# Patient Record
Sex: Male | Born: 1980 | Hispanic: No | Marital: Married | Smoking: Never smoker
Health system: Southern US, Community
[De-identification: ages and names within clinical notes are randomized; demographics above are authoritative.]

## PROBLEM LIST (undated history)

## (undated) DIAGNOSIS — E785 Hyperlipidemia, unspecified: Secondary | ICD-10-CM

## (undated) DIAGNOSIS — E781 Pure hyperglyceridemia: Secondary | ICD-10-CM

## (undated) HISTORY — DX: Hyperlipidemia, unspecified: E78.5

## (undated) HISTORY — DX: Pure hyperglyceridemia: E78.1

---

## 2009-09-14 HISTORY — PX: CARDIOVASCULAR STRESS TEST: SHX262

## 2010-07-24 ENCOUNTER — Ambulatory Visit: Payer: Self-pay | Admitting: Internal Medicine

## 2010-07-24 DIAGNOSIS — M79609 Pain in unspecified limb: Secondary | ICD-10-CM

## 2010-07-24 DIAGNOSIS — E785 Hyperlipidemia, unspecified: Secondary | ICD-10-CM | POA: Insufficient documentation

## 2010-07-24 HISTORY — DX: Hyperlipidemia, unspecified: E78.5

## 2010-07-24 LAB — CONVERTED CEMR LAB
Alkaline Phosphatase: 55 units/L (ref 39–117)
Basophils Absolute: 0 10*3/uL (ref 0.0–0.1)
Bilirubin Urine: NEGATIVE
Bilirubin, Direct: 0.1 mg/dL (ref 0.0–0.3)
CO2: 31 meq/L (ref 19–32)
Calcium: 9.5 mg/dL (ref 8.4–10.5)
Creatinine, Ser: 0.9 mg/dL (ref 0.4–1.5)
Eosinophils Absolute: 0.1 10*3/uL (ref 0.0–0.7)
GFR calc non Af Amer: 113.13 mL/min (ref >60)
Glucose, Bld: 84 mg/dL (ref 70–99)
HDL: 27.1 mg/dL — ABNORMAL LOW (ref >39.00)
Hemoglobin, Urine: NEGATIVE
Leukocytes, UA: NEGATIVE
Lymphocytes Relative: 35.8 % (ref 12.0–46.0)
MCHC: 34.7 g/dL (ref 30.0–36.0)
Monocytes Relative: 10.9 % (ref 3.0–12.0)
Neutrophils Relative %: 51.3 % (ref 43.0–77.0)
Nitrite: NEGATIVE
RBC: 5.23 M/uL (ref 4.22–5.81)
RDW: 12.3 % (ref 11.5–14.6)
TSH: 1.02 microintl units/mL (ref 0.35–5.50)
Total CHOL/HDL Ratio: 6
Urobilinogen, UA: 0.2 (ref 0.0–1.0)
VLDL: 44 mg/dL — ABNORMAL HIGH (ref 0.0–40.0)
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
## 2011-04-30 ENCOUNTER — Emergency Department (HOSPITAL_COMMUNITY)
Admission: EM | Admit: 2011-04-30 | Discharge: 2011-05-01 | Disposition: A | Discharge status: Home / Self Care | Payer: BC Managed Care – PPO | Attending: Emergency Medicine | Admitting: Emergency Medicine

## 2011-04-30 ENCOUNTER — Emergency Department (HOSPITAL_COMMUNITY): Payer: BC Managed Care – PPO

## 2011-04-30 DIAGNOSIS — S62639A Displaced fracture of distal phalanx of unspecified finger, initial encounter for closed fracture: Secondary | ICD-10-CM | POA: Insufficient documentation

## 2011-04-30 DIAGNOSIS — Y9364 Activity, baseball: Secondary | ICD-10-CM | POA: Insufficient documentation

## 2011-04-30 DIAGNOSIS — Y9239 Other specified sports and athletic area as the place of occurrence of the external cause: Secondary | ICD-10-CM | POA: Insufficient documentation

## 2011-04-30 DIAGNOSIS — W219XXA Striking against or struck by unspecified sports equipment, initial encounter: Secondary | ICD-10-CM | POA: Insufficient documentation

## 2011-04-30 DIAGNOSIS — S61209A Unspecified open wound of unspecified finger without damage to nail, initial encounter: Secondary | ICD-10-CM | POA: Insufficient documentation

## 2011-11-01 ENCOUNTER — Encounter: Payer: Self-pay | Admitting: Internal Medicine

## 2011-11-01 DIAGNOSIS — E781 Pure hyperglyceridemia: Secondary | ICD-10-CM

## 2011-11-01 DIAGNOSIS — Z0000 Encounter for general adult medical examination without abnormal findings: Secondary | ICD-10-CM | POA: Insufficient documentation

## 2011-11-01 HISTORY — DX: Pure hyperglyceridemia: E78.1

## 2011-11-02 ENCOUNTER — Other Ambulatory Visit (INDEPENDENT_AMBULATORY_CARE_PROVIDER_SITE_OTHER): Payer: BC Managed Care – PPO

## 2011-11-02 ENCOUNTER — Encounter: Payer: Self-pay | Admitting: Internal Medicine

## 2011-11-02 ENCOUNTER — Ambulatory Visit (INDEPENDENT_AMBULATORY_CARE_PROVIDER_SITE_OTHER): Payer: BC Managed Care – PPO | Admitting: Internal Medicine

## 2011-11-02 VITALS — BP 108/80 | HR 79 | Temp 98.1°F | Ht 70.0 in | Wt 179.0 lb

## 2011-11-02 DIAGNOSIS — M779 Enthesopathy, unspecified: Secondary | ICD-10-CM | POA: Insufficient documentation

## 2011-11-02 DIAGNOSIS — Z0000 Encounter for general adult medical examination without abnormal findings: Secondary | ICD-10-CM

## 2011-11-02 LAB — CBC WITH DIFFERENTIAL/PLATELET
Eosinophils Relative: 3 % (ref 0.0–5.0)
HCT: 50.6 % (ref 39.0–52.0)
Hemoglobin: 17.5 g/dL — ABNORMAL HIGH (ref 13.0–17.0)
Lymphs Abs: 3.2 10*3/uL (ref 0.7–4.0)
Monocytes Relative: 10.4 % (ref 3.0–12.0)
Neutro Abs: 3.3 10*3/uL (ref 1.4–7.7)
RDW: 12.9 % (ref 11.5–14.6)
WBC: 7.6 10*3/uL (ref 4.5–10.5)

## 2011-11-02 LAB — LIPID PANEL
Cholesterol: 174 mg/dL (ref 0–200)
Total CHOL/HDL Ratio: 4
VLDL: 41.4 mg/dL — ABNORMAL HIGH (ref 0.0–40.0)

## 2011-11-02 LAB — URINALYSIS, ROUTINE W REFLEX MICROSCOPIC
Bilirubin Urine: NEGATIVE
Ketones, ur: NEGATIVE
Leukocytes, UA: NEGATIVE
Specific Gravity, Urine: >=1.03 (ref 1.000–1.030)
Urine Glucose: NEGATIVE
Urobilinogen, UA: 0.2 (ref 0.0–1.0)

## 2011-11-02 LAB — HEPATIC FUNCTION PANEL
Albumin: 4.5 g/dL (ref 3.5–5.2)
Alkaline Phosphatase: 57 U/L (ref 39–117)
Total Bilirubin: 1.2 mg/dL (ref 0.3–1.2)

## 2011-11-02 LAB — TSH: TSH: 2.26 u[IU]/mL (ref 0.35–5.50)

## 2011-11-02 LAB — BASIC METABOLIC PANEL
Calcium: 9.7 mg/dL (ref 8.4–10.5)
GFR: 107.75 mL/min (ref >60.00)
Glucose, Bld: 94 mg/dL (ref 70–99)
Sodium: 141 mEq/L (ref 135–145)

## 2011-11-02 NOTE — Assessment & Plan Note (Signed)
Mild medial right epicondylitis, for advil prn,  to f/u any worsening symptoms or concerns

## 2011-11-02 NOTE — Patient Instructions (Signed)
Continue all other medications as before such as OTC advil as needed Please go to LAB in the Basement for the blood and/or urine tests to be done today Please call the phone number [phone] (the PhoneTree System) for results of testing in 2-3 days;  When calling, simply dial the number, and when prompted enter the MRN number above (the Medical Record Number) and the # key, then the message should start. Please return in 1 year for your yearly visit, or sooner if needed, with Lab testing done 3-5 days before

## 2011-11-02 NOTE — Assessment & Plan Note (Signed)

## 2011-11-02 NOTE — Progress Notes (Signed)
Subjective:    Patient ID: Daniel Gallegos, male    DOB: 1981/06/30, 31 y.o.   MRN: [id number]  HPI  Here for wellness and f/u;  Overall doing ok;  Pt denies CP, worsening SOB, DOE, wheezing, orthopnea, PND, worsening LE edema, palpitations, dizziness or syncope.  Pt denies neurological change such as new Headache, facial or extremity weakness.  Pt denies polydipsia, polyuria, or low sugar symptoms. Pt states overall good compliance with treatment and medications, good tolerability, and trying to follow lower cholesterol diet.  Pt denies worsening depressive symptoms, suicidal ideation or panic. No fever, wt loss, night sweats, loss of appetite, or other constitutional symptoms.  Pt states good ability with ADL's, low fall risk, home safety reviewed and adequate, no significant changes in hearing or vision, and occasionally active with exercise.  Did have a pain to the left medial distal quad with walking around [place].  Has some pain to the medial epicondylar area. Past Medical History  Diagnosis Date  . Hypertriglyceridemia 11/01/2011  . HYPERLIPIDEMIA 07/24/2010   Past Surgical History  Procedure Date  . Cardiovascular stress test Jan 2011    s/p    reports that he has never smoked. He does not have any smokeless tobacco history on file. He reports that he drinks alcohol. He reports that he does not use illicit drugs. family history includes Alcohol abuse in his other and paternal uncle; Arthritis in his daughter; Cancer in his maternal uncle and other; Depression in his other; Heart attack in his father; and Heart disease in his other. No Known Allergies No current outpatient prescriptions on file prior to visit.   Review of Systems Review of Systems  Constitutional: Negative for diaphoresis, activity change, appetite change and unexpected weight change.  HENT: Negative for hearing loss, ear pain, facial swelling, mouth sores and neck stiffness.   Eyes: Negative for pain, redness and  visual disturbance.  Respiratory: Negative for shortness of breath and wheezing.   Cardiovascular: Negative for chest pain and palpitations.  Gastrointestinal: Negative for diarrhea, blood in stool, abdominal distention and rectal pain.  Genitourinary: Negative for hematuria, flank pain and decreased urine volume.  Musculoskeletal: Negative for myalgias and joint swelling.  Skin: Negative for color change and wound.  Neurological: Negative for syncope and numbness.  Hematological: Negative for adenopathy.  Psychiatric/Behavioral: Negative for hallucinations, self-injury, decreased concentration and agitation.      Objective:   Physical Exam BP 108/80  Pulse 79  Temp(Src) 98.1 F (36.7 C) (Oral)  Ht 5\' 10"  (1.778 m)  Wt 179 lb (81.194 kg)  BMI 25.68 kg/m2  SpO2 96% Physical Exam  VS noted Constitutional: Pt is oriented to person, place, and time. Appears well-developed and well-nourished.  HENT:  Head: Normocephalic and atraumatic.  Right Ear: External ear normal.  Left Ear: External ear normal.  Nose: Nose normal.  Mouth/Throat: Oropharynx is clear and moist.  Eyes: Conjunctivae and EOM are normal. Pupils are equal, round, and reactive to light.  Neck: Normal range of motion. Neck supple. No JVD present. No tracheal deviation present.  Cardiovascular: Normal rate, regular rhythm, normal heart sounds and intact distal pulses.   Pulmonary/Chest: Effort normal and breath sounds normal.  Abdominal: Soft. Bowel sounds are normal. There is no tenderness.  Musculoskeletal: Normal range of motion. Exhibits no edema.  mild tender right medial epicondylar area Lymphadenopathy:  Has no cervical adenopathy.  Neurological: Pt is alert and oriented to person, place, and time. Pt has normal reflexes. No cranial nerve  deficit.  Skin: Skin is warm and dry. No rash noted.  Psychiatric:  Has  normal mood and affect. Behavior is normal.     Assessment & Plan:

## 2012-12-29 ENCOUNTER — Telehealth: Payer: Self-pay

## 2012-12-29 ENCOUNTER — Ambulatory Visit (INDEPENDENT_AMBULATORY_CARE_PROVIDER_SITE_OTHER): Payer: BC Managed Care – PPO | Admitting: Emergency Medicine

## 2012-12-29 ENCOUNTER — Ambulatory Visit: Payer: BC Managed Care – PPO

## 2012-12-29 VITALS — BP 106/72 | HR 73 | Temp 98.2°F | Resp 16 | Ht 68.78 in | Wt 176.8 lb

## 2012-12-29 DIAGNOSIS — E785 Hyperlipidemia, unspecified: Secondary | ICD-10-CM

## 2012-12-29 DIAGNOSIS — R079 Chest pain, unspecified: Secondary | ICD-10-CM

## 2012-12-29 DIAGNOSIS — Z0000 Encounter for general adult medical examination without abnormal findings: Secondary | ICD-10-CM

## 2012-12-29 DIAGNOSIS — R0602 Shortness of breath: Secondary | ICD-10-CM

## 2012-12-29 LAB — COMPREHENSIVE METABOLIC PANEL
ALT: 21 U/L (ref 0–53)
AST: 21 U/L (ref 0–37)
Albumin: 4.5 g/dL (ref 3.5–5.2)
Alkaline Phosphatase: 57 U/L (ref 39–117)
BUN: 11 mg/dL (ref 6–23)
CO2: 29 mEq/L (ref 19–32)
Calcium: 9.8 mg/dL (ref 8.4–10.5)
Chloride: 101 mEq/L (ref 96–112)
Creat: 0.98 mg/dL (ref 0.50–1.35)
Glucose, Bld: 89 mg/dL (ref 70–99)
Potassium: 4.8 mEq/L (ref 3.5–5.3)
Sodium: 137 mEq/L (ref 135–145)
Total Bilirubin: 1.7 mg/dL — ABNORMAL HIGH (ref 0.3–1.2)
Total Protein: 6.9 g/dL (ref 6.0–8.3)

## 2012-12-29 LAB — POCT URINALYSIS DIPSTICK
Bilirubin, UA: NEGATIVE
Blood, UA: NEGATIVE
Glucose, UA: NEGATIVE
Ketones, UA: NEGATIVE
Leukocytes, UA: NEGATIVE
Nitrite, UA: NEGATIVE
Protein, UA: NEGATIVE
Spec Grav, UA: 1.015
Urobilinogen, UA: 0.2
pH, UA: 7

## 2012-12-29 LAB — POCT CBC
Granulocyte percent: 48.3 %G (ref 37–80)
MID (cbc): 0.5 (ref 0–0.9)
POC Granulocyte: 2.5 (ref 2–6.9)
POC LYMPH PERCENT: 41.8 %L (ref 10–50)
POC MID %: 9.9 %M (ref 0–12)
Platelet Count, POC: 239 10*3/uL (ref 142–424)
RDW, POC: 12.7 %

## 2012-12-29 LAB — LIPID PANEL
LDL Cholesterol: 112 mg/dL — ABNORMAL HIGH (ref 0–99)
Total CHOL/HDL Ratio: 5.1 Ratio
Triglycerides: 151 mg/dL — ABNORMAL HIGH (ref <150)
VLDL: 30 mg/dL (ref 0–40)

## 2012-12-29 NOTE — Telephone Encounter (Signed)
Papers are at TL desk with EKG whenever you are ready to look at them.

## 2012-12-29 NOTE — Progress Notes (Addendum)
@[email or web address] @  Patient ID: Daniel Gallegos MRN: [id number], DOB: 1981/01/11 32 y.o. Date of Encounter: 12/29/2012, 9:29 AM  Primary Physician: Oliver Barre, MD  Chief Complaint: Physical (CPE)  HPI: 32 y.o. y/o male with history noted below here for CPE.  Doing well. No issues/complaints.  Review of Systems:  Consitutional: No fever, chills, fatigue, night sweats, lymphadenopathy, or weight changes. Eyes: No visual changes, eye redness, or discharge. ENT/Mouth: Ears: No otalgia, tinnitus, hearing loss, discharge. Nose: No congestion, rhinorrhea, sinus pain, or epistaxis. Throat: No sore throat, post nasal drip, or teeth pain. Cardiovascular: Patient states that on Sunday he was playing baseball. He dove into second base and fell flat on his chest. Later in the game he had a double and was running the second base and noticed he was extremely short of breath. Since that time he has had chest discomfort when he exercises or stresses himself.The pain and chest tightness comes after he runs about a mile and a half. No nausea ,radiation of pain or diaphoresis. Some problems for about 6 weeks Respiratory: No cough, hemoptysis, or wheezing. Experiencing SOB and chest tightness. Gastrointestinal: No anorexia, dysphagia, reflux, pain, nausea, vomiting, hematemesis, diarrhea, constipation, BRBPR, or melena. Genitourinary: No dysuria, frequency, urgency, hematuria, incontinence, nocturia, decreased urinary stream, discharge, impotence, or testicular pain/masses. Musculoskeletal: No decreased ROM, myalgias, stiffness, joint swelling, or weakness. Skin: No rash, erythema, lesion changes, pain, warmth, jaundice, or pruritis. Neurological: No headache, dizziness, syncope, seizures, tremors, memory loss, coordination problems, or paresthesias. Psychological: No anxiety, depression, hallucinations, SI/HI. Endocrine: No fatigue, polydipsia, polyphagia, polyuria, or known diabetes. All other systems were reviewed  and are otherwise negative.  Past Medical History  Diagnosis Date  . Hypertriglyceridemia 11/01/2011  . HYPERLIPIDEMIA 07/24/2010     Past Surgical History  Procedure Laterality Date  . Cardiovascular stress test  Jan 2011    s/p    Home Meds:  Prior to Admission medications   Not on File    Allergies: No Known Allergies  History   Social History  . Marital Status: Single    Spouse Name: N/A    Number of Children: 0  . Years of Education: N/A   Occupational History  . [employer]   Social History Main Topics  . Smoking status: Never Smoker   . Smokeless tobacco: Not on file  . Alcohol Use: Yes  . Drug Use: No  . Sexually Active: Yes    Birth Control/ Protection: None   Other Topics Concern  . 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 Heart attack Father   . Hypertension Father   . Arthritis Daughter   . Cancer Maternal Uncle     Colon Cancer  . Alcohol abuse Paternal Uncle   . Heart disease Other     Paternal-Mult Heart Disease  . Cancer Other     Maternal-Mult Cancer  . Depression Other     Grandmother  . Alcohol abuse Other     Extended relatives  . COPD Mother   . Emphysema Mother   . Alzheimer's disease Maternal Grandmother   . Emphysema Maternal Grandfather   . Macular degeneration Paternal Grandmother     Physical Exam: Blood pressure 106/72, pulse 73, temperature 98.2 F (36.8 C), temperature source Oral, resp. rate 16, height 5' 8.78" (1.747 m), weight 176 lb 12.8  oz (80.196 kg), SpO2 98.00%.  General: Well developed, well nourished, in no acute distress. HEENT: Normocephalic, atraumatic. Conjunctiva pink, sclera non-icteric. Pupils 2 mm constricting to 1 mm, round, regular, and equally reactive to light and accomodation. EOMI. Internal auditory canal clear. TMs with good cone of light and without pathology. Nasal  mucosa pink. Nares are without discharge. No sinus tenderness. Oral mucosa pink. Dentition . Pharynx without exudate.   Neck: Supple. Trachea midline. No thyromegaly. Full ROM. No lymphadenopathy. Lungs: Clear to auscultation bilaterally without wheezes, rales, or rhonchi. Breathing is of normal effort and unlabored. Cardiovascular: RRR with S1 S2. No murmurs, rubs, or gallops appreciated. Distal pulses 2+ symmetrically. No carotid or abdominal bruits.  Abdomen: Soft, non-tender, non-distended with normoactive bowel sounds. No hepatosplenomegaly or masses. No rebound/guarding. No CVA tenderness. Without hernias.  Rectal not done  Genitourinary:   circumcised male. No penile lesions. Testes descended bilaterally, and smooth without tenderness or masses.  Musculoskeletal: Full range of motion and 5/5 strength throughout. Without swelling, atrophy, tenderness, crepitus, or warmth. Extremities without clubbing, cyanosis, or edema. Calves supple. Skin: Warm and moist without erythema, ecchymosis, wounds, or rash. Neuro: A+Ox3. CN II-XII grossly intact. Moves all extremities spontaneously. Full sensation throughout. Normal gait. DTR 2+ throughout upper and lower extremities. Finger to nose intact. Psych:  Responds to questions appropriately with a normal affect.  [facility] reading (PRIMARY) by  Dr. Cleta Alberts is no acute disease no pneumothorax is seen. EKG shows high voltage with large T waves all through the precordium. Old EKG requested for evaluation. Results for orders placed in visit on 12/29/12  POCT CBC      Result Value Range   WBC 5.2  4.6 - 10.2 K/uL   Lymph, poc 2.2  0.6 - 3.4   POC LYMPH PERCENT 41.8  10 - 50 %L   MID (cbc) 0.5  0 - 0.9   POC MID % 9.9  0 - 12 %M   POC Granulocyte 2.5  2 - 6.9   Granulocyte percent 48.3  37 - 80 %G   RBC 5.46  4.69 - 6.13 M/uL   Hemoglobin 17.1  14.1 - 18.1 g/dL   HCT, POC 16.1  09.6 - 53.7 %   MCV 94.3  80 - 97 fL   MCH, POC 31.3 (*) 27 - 31.2 pg   MCHC  33.2  31.8 - 35.4 g/dL   RDW, POC 04.5     Platelet Count, POC 239  142 - 424 K/uL   MPV 8.6  0 - 99.8 fL  POCT URINALYSIS DIPSTICK      Result Value Range   Color, UA amber     Clarity, UA clear     Glucose, UA neg     Bilirubin, UA neg     Ketones, UA neg     Spec Grav, UA 1.015     Blood, UA neg     pH, UA 7.0     Protein, UA neg     Urobilinogen, UA 0.2     Nitrite, UA neg     Leukocytes, UA Negative     Studies: CBC, CMET, Lipid,      Assessment/Plan:  32 y.o. y/o white male here for a physical exam. He had an episode of chest wall contusion while playing baseball on Sunday. This was 4 days ago. Since that time he has had shortness of breath with exertion. He had no chest pain. His father had heart disease which developed in this  30's. Patient states he did undergo cardiac evaluation.He had a stress test with Dr. Reyes Ivan 2011.On further history he has been having chest tightness and sob when he is maximally exerting himself. To take on ASA a day pending eval. To ER if worsening. -  Signed, Earl Lites, MD 12/29/2012 9:29 AM

## 2012-12-29 NOTE — Telephone Encounter (Signed)
I reviewed - looked ok to wait until the am.

## 2012-12-29 NOTE — Patient Instructions (Signed)
Peripheral has been made to cardiology for an evaluation of your EKG and symptoms he should hear from [place] within the next 24-48 hours  regarding your evaluation

## 2012-12-29 NOTE — Telephone Encounter (Signed)
Pt saw dr Cleta Alberts today and rtc to drop off records he and dr Cleta Alberts talked about.  Notes are in dr daub's box.  bf

## 2013-01-04 ENCOUNTER — Other Ambulatory Visit: Payer: Self-pay | Admitting: Cardiology

## 2013-01-04 DIAGNOSIS — S299XXS Unspecified injury of thorax, sequela: Secondary | ICD-10-CM

## 2013-01-09 ENCOUNTER — Ambulatory Visit
Admission: RE | Admit: 2013-01-09 | Discharge: 2013-01-09 | Disposition: A | Discharge status: Home / Self Care | Payer: BC Managed Care – PPO | Source: Ambulatory Visit | Attending: Cardiology | Admitting: Cardiology

## 2013-01-09 DIAGNOSIS — S299XXS Unspecified injury of thorax, sequela: Secondary | ICD-10-CM

## 2013-01-09 MED ORDER — IOHEXOL 300 MG/ML  SOLN
75.0000 mL | Freq: Once | INTRAMUSCULAR | Status: AC | PRN
  Administered 2013-01-09: 75 mL via INTRAVENOUS

## 2013-01-11 ENCOUNTER — Telehealth: Payer: Self-pay | Admitting: Radiology

## 2013-01-11 DIAGNOSIS — K7689 Other specified diseases of liver: Secondary | ICD-10-CM

## 2013-01-11 NOTE — Telephone Encounter (Signed)
Called him, he was upset Dr Sherril Croon did not  Advise him of this. I advised him this is incidental finding, and was forwarded to Dr Cleta Alberts. Not related to his current problems with chest pains. Copy of report mailed to patient.

## 2013-01-11 NOTE — Telephone Encounter (Signed)
Called patient about his CT scan of his chest. It was normal, however incidental findings on  The scan show multiple cysts of his liver. Dr Cleta Alberts wants [place] of this area in 3 months to follow up. Also kidney since there is a cyst of this area as well. Left message for patient to call me back so I can advise, have ordered the [place].

## 2013-01-12 ENCOUNTER — Telehealth: Payer: Self-pay

## 2013-01-12 DIAGNOSIS — K7689 Other specified diseases of liver: Secondary | ICD-10-CM

## 2013-01-12 NOTE — Telephone Encounter (Signed)
Pt is calling back to speak to Amy about his CT results Call back number is [phone]

## 2013-01-12 NOTE — Telephone Encounter (Signed)
Spoke to patient he wants to proceed with the [place] now, he is scheduled for July. Advised him this is fine ordered scan for now.

## 2013-01-13 ENCOUNTER — Ambulatory Visit: Payer: BC Managed Care – PPO | Admitting: Cardiovascular Disease

## 2013-01-13 ENCOUNTER — Telehealth: Payer: Self-pay

## 2013-01-13 NOTE — Telephone Encounter (Signed)
Pt has questions regarding referral to [facility] [facility]. Best# [id number]

## 2013-01-13 NOTE — Telephone Encounter (Signed)
, °

## 2013-01-15 NOTE — Telephone Encounter (Signed)
Spoke with pt, he wanted to see if he can get his [place] on Friday 5/9 at 745 or 8. Pt has to work all this week and this would be a good time for him.

## 2013-01-17 ENCOUNTER — Telehealth: Payer: Self-pay

## 2013-01-17 NOTE — Telephone Encounter (Signed)
Thanks, I have called him to advise. The [place] is scheduled for this Friday.

## 2013-01-17 NOTE — Telephone Encounter (Signed)
Please call patient let him know we need to see what the lesions look like on x-ray first. Once we have that I will be happy to refer him to one of the specialists to see if a biopsy is indicated .

## 2013-01-17 NOTE — Telephone Encounter (Signed)
Patient scheduled for [place] of cysts seen on liver, now he wants biopsy.

## 2013-01-17 NOTE — Telephone Encounter (Signed)
PT WOULD LIKE TO SPEAK WITH DR DAUB REGARDING AN ULTRA SOUND HE IS TO HAVE DONE, BUT DIDN'T KNOW IF HE SHOULD HAVE A BIOPSY DONE INSTEAD PLEASE CALL [phone]

## 2013-01-20 ENCOUNTER — Ambulatory Visit
Admission: RE | Admit: 2013-01-20 | Discharge: 2013-01-20 | Disposition: A | Discharge status: Home / Self Care | Payer: BC Managed Care – PPO | Source: Ambulatory Visit | Attending: Emergency Medicine | Admitting: Emergency Medicine

## 2013-01-20 DIAGNOSIS — K7689 Other specified diseases of liver: Secondary | ICD-10-CM

## 2013-01-24 ENCOUNTER — Other Ambulatory Visit: Payer: Self-pay | Admitting: Radiology

## 2013-01-24 DIAGNOSIS — K7689 Other specified diseases of liver: Secondary | ICD-10-CM

## 2013-02-01 ENCOUNTER — Telehealth: Payer: Self-pay

## 2013-02-01 NOTE — Telephone Encounter (Signed)
Pt is needing to talk with dr Cleta Alberts once he returns in office tomorrow

## 2013-02-02 NOTE — Telephone Encounter (Signed)
Call patient to return to clinic to see me tomorrow Saturday or Sunday I will be happy to discuss with him the findings on his scan.

## 2013-02-02 NOTE — Telephone Encounter (Signed)
To clinical °

## 2013-02-02 NOTE — Telephone Encounter (Signed)
Patient has been set up with GI Dr for his liver cysts as seen on scans/ he is very anxious about this, do you want me to have him come in?

## 2013-02-03 ENCOUNTER — Telehealth: Payer: Self-pay | Admitting: Radiology

## 2013-02-03 NOTE — Telephone Encounter (Signed)
Thanks I have called him to advise of your hours, to call me if there is anything I can help him with. Left detailed message.

## 2013-02-03 NOTE — Telephone Encounter (Signed)
Patient called back, states he was cleared by the Gastroenterologist in regards to the cysts. He does not have any further questions for you. To you FYI

## 2013-02-08 ENCOUNTER — Encounter: Payer: Self-pay | Admitting: Emergency Medicine

## 2013-04-12 ENCOUNTER — Other Ambulatory Visit: Payer: BC Managed Care – PPO

## 2014-10-05 ENCOUNTER — Ambulatory Visit: Payer: BC Managed Care – PPO | Admitting: Internal Medicine

## 2014-11-27 ENCOUNTER — Ambulatory Visit (INDEPENDENT_AMBULATORY_CARE_PROVIDER_SITE_OTHER): Payer: BC Managed Care – PPO | Admitting: Physician Assistant

## 2014-11-27 VITALS — BP 124/88 | HR 84 | Temp 97.8°F | Resp 17 | Ht 70.0 in | Wt 195.6 lb

## 2014-11-27 DIAGNOSIS — J101 Influenza due to other identified influenza virus with other respiratory manifestations: Secondary | ICD-10-CM

## 2014-11-27 DIAGNOSIS — R509 Fever, unspecified: Secondary | ICD-10-CM | POA: Diagnosis not present

## 2014-11-27 LAB — POCT INFLUENZA A/B
INFLUENZA A, POC: POSITIVE
Influenza B, POC: NEGATIVE

## 2014-11-27 NOTE — Patient Instructions (Addendum)
Your flu swab was positive.  Please be sure to get plenty of rest and drink plenty of fluids. I don't think you have a bacterial infection causing you symptoms at this time.  If you're not feeling better in 3-4 days, please return to clinic for further evaluation.

## 2014-11-27 NOTE — Progress Notes (Signed)
   Subjective:    Patient ID: Daniel Gallegos, male    DOB: 02/14/1981, 34 y.o.   MRN: [id number]  Chief Complaint  Patient presents with  . Fever    x 3 days  . Nasal Congestion    x 2 days   Prior to Admission medications   Not on File   Medications, allergies, past medical history, surgical history, family history, social history and problem list reviewed and updated.  HPI  [age] yom with no significant pmh presents with 3 day h/o mild cough, fever, rhinorrhea.  Sx started with gradual onset 3 days ago. Initially felt groggy, achy. Did not have fever 1st day.   Fever started next day with temp 102. Over next two days has been taking ibuprofen prn. Temp was 100.3 yest otherwise has been normal even without ibuprofen. Rhinorrhea past 2 days. Mild prod cough with yellow sputum past two days. 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   Feels that he has been feeling better past 24 hrs. Fever has eased up and groggy, achiness has improved. Denies abd pain, n/v, diarrhea.   Review of Systems No cp, sob.     Objective:   Physical Exam  Constitutional: He appears well-developed and well-nourished.  Non-toxic appearance. He does not have a sickly appearance. He does not appear ill. No distress.  BP 124/88 mmHg  Pulse 84  Temp(Src) 97.8 F (36.6 C) (Oral)  Resp 17  Ht 5\' 10"  (1.778 m)  Wt 195 lb 9.6 oz (88.724 kg)  BMI 28.07 kg/m2  SpO2 97%   HENT:  Right Ear: Tympanic membrane normal.  Left Ear: Tympanic membrane normal.  Nose: Nose normal. Right sinus exhibits no maxillary sinus tenderness and no frontal sinus tenderness. Left sinus exhibits no maxillary sinus tenderness and no frontal sinus tenderness.  Mouth/Throat: Uvula is midline and oropharynx is clear and moist.  Pulmonary/Chest: Effort normal and breath sounds normal. He has no decreased breath sounds. He has no wheezes. He has no rhonchi. He has no rales.  Lymphadenopathy:       Head (right  side): No submental, no submandibular and no tonsillar adenopathy present.       Head (left side): No submental, no submandibular and no tonsillar adenopathy present.    He has no cervical adenopathy.   Results for orders placed or performed in visit on 11/27/14  POCT Influenza A/B  Result Value Ref Range   Influenza A, POC Positive    Influenza B, POC Negative       Assessment & Plan:   [age] yom with no significant pmh presents with 3 day h/o mild cough, fever, rhinorrhea.  Fever, unspecified fever cause - Plan: POCT Influenza A/B Influenza A --flu swab positive --pt is greater than 72 hrs past sx onset at this time --discussed option of tamiflu as pt is outside of ideal window since sx started but could still be given, pt declines at this time --rest/fluids/off work tomorrow/ibuprofen prn --rtc 3-4 days if not improving  Donnajean Lopesodd M. Jamorian Dimaria, PA-C Physician Assistant-Certified [facility]  11/28/2014 9:26 AM

## 2014-11-28 DIAGNOSIS — J101 Influenza due to other identified influenza virus with other respiratory manifestations: Secondary | ICD-10-CM | POA: Insufficient documentation

## 2014-11-28 NOTE — Progress Notes (Signed)
  Medical screening examination/treatment/procedure(s) were performed by non-physician practitioner and as supervising physician I was immediately available for consultation/collaboration.     

## 2014-12-13 ENCOUNTER — Encounter: Payer: Self-pay | Admitting: Internal Medicine

## 2014-12-13 ENCOUNTER — Ambulatory Visit (INDEPENDENT_AMBULATORY_CARE_PROVIDER_SITE_OTHER): Payer: BC Managed Care – PPO | Admitting: Internal Medicine

## 2014-12-13 ENCOUNTER — Other Ambulatory Visit (INDEPENDENT_AMBULATORY_CARE_PROVIDER_SITE_OTHER): Payer: BC Managed Care – PPO

## 2014-12-13 VITALS — BP 118/80 | HR 75 | Temp 98.0°F | Resp 18 | Ht 70.0 in | Wt 193.1 lb

## 2014-12-13 DIAGNOSIS — Z0000 Encounter for general adult medical examination without abnormal findings: Secondary | ICD-10-CM | POA: Diagnosis not present

## 2014-12-13 LAB — URINALYSIS, ROUTINE W REFLEX MICROSCOPIC
Bilirubin Urine: NEGATIVE
Hgb urine dipstick: NEGATIVE
Ketones, ur: NEGATIVE
LEUKOCYTES UA: NEGATIVE
NITRITE: NEGATIVE
RBC / HPF: NONE SEEN (ref 0–?)
Specific Gravity, Urine: 1.02 (ref 1.000–1.030)
Total Protein, Urine: NEGATIVE
Urine Glucose: NEGATIVE
Urobilinogen, UA: 0.2 (ref 0.0–1.0)
pH: 6 (ref 5.0–8.0)

## 2014-12-13 LAB — LIPID PANEL
Cholesterol: 170 mg/dL (ref 0–200)
HDL: 31.2 mg/dL — AB (ref >39.00)
LDL Cholesterol: 104 mg/dL — ABNORMAL HIGH (ref 0–99)
NONHDL: 138.8
Total CHOL/HDL Ratio: 5
Triglycerides: 174 mg/dL — ABNORMAL HIGH (ref 0.0–149.0)
VLDL: 34.8 mg/dL (ref 0.0–40.0)

## 2014-12-13 LAB — HEPATIC FUNCTION PANEL
ALBUMIN: 4.4 g/dL (ref 3.5–5.2)
ALK PHOS: 59 U/L (ref 39–117)
ALT: 28 U/L (ref 0–53)
AST: 27 U/L (ref 0–37)
BILIRUBIN TOTAL: 1.3 mg/dL — AB (ref 0.2–1.2)
Bilirubin, Direct: 0.2 mg/dL (ref 0.0–0.3)
Total Protein: 7.3 g/dL (ref 6.0–8.3)

## 2014-12-13 LAB — CBC WITH DIFFERENTIAL/PLATELET
BASOS PCT: 0.5 % (ref 0.0–3.0)
Basophils Absolute: 0 10*3/uL (ref 0.0–0.1)
EOS ABS: 0.2 10*3/uL (ref 0.0–0.7)
Eosinophils Relative: 2.8 % (ref 0.0–5.0)
HCT: 50 % (ref 39.0–52.0)
HEMOGLOBIN: 17.4 g/dL — AB (ref 13.0–17.0)
Lymphocytes Relative: 35.1 % (ref 12.0–46.0)
Lymphs Abs: 2.6 10*3/uL (ref 0.7–4.0)
MCHC: 34.8 g/dL (ref 30.0–36.0)
MCV: 89.9 fl (ref 78.0–100.0)
MONOS PCT: 13.2 % — AB (ref 3.0–12.0)
Monocytes Absolute: 1 10*3/uL (ref 0.1–1.0)
NEUTROS ABS: 3.6 10*3/uL (ref 1.4–7.7)
Neutrophils Relative %: 48.4 % (ref 43.0–77.0)
Platelets: 254 10*3/uL (ref 150.0–400.0)
RBC: 5.56 Mil/uL (ref 4.22–5.81)
RDW: 13.1 % (ref 11.5–15.5)
WBC: 7.3 10*3/uL (ref 4.0–10.5)

## 2014-12-13 LAB — BASIC METABOLIC PANEL
BUN: 23 mg/dL (ref 6–23)
CO2: 29 mEq/L (ref 19–32)
Calcium: 9.7 mg/dL (ref 8.4–10.5)
Chloride: 102 mEq/L (ref 96–112)
Creatinine, Ser: 1.16 mg/dL (ref 0.40–1.50)
GFR: 76.8 mL/min (ref >60.00)
GLUCOSE: 99 mg/dL (ref 70–99)
POTASSIUM: 4.5 meq/L (ref 3.5–5.1)
Sodium: 138 mEq/L (ref 135–145)

## 2014-12-13 LAB — TSH: TSH: 2.11 u[IU]/mL (ref 0.35–4.50)

## 2014-12-13 NOTE — Progress Notes (Signed)
Subjective:    Patient ID: Daniel Gallegos, male    DOB: 08/16/1981, 34 y.o.   MRN: [id number]  HPI  Here for wellness and f/u;  Overall doing ok;  Pt denies Chest pain, worsening SOB, DOE, wheezing, orthopnea, PND, worsening LE edema, palpitations, dizziness or syncope.  Pt denies neurological change such as new headache, facial or extremity weakness.  Pt denies polydipsia, polyuria, or low sugar symptoms. Pt states overall good compliance with treatment and medications, good tolerability, and has been trying to follow appropriate diet.  Pt denies worsening depressive symptoms, suicidal ideation or panic. No fever, night sweats, wt loss, loss of appetite, or other constitutional symptoms.  Pt states good ability with ADL's, has low fall risk, home safety reviewed and adequate, no other significant changes in hearing or vision, and very active with exercise at [employer] daily, as well as playing on a local bball team..   Past Medical History  Diagnosis Date  . Hypertriglyceridemia 11/01/2011  . HYPERLIPIDEMIA 07/24/2010   Past Surgical History  Procedure Laterality Date  . Cardiovascular stress test  Jan 2011    s/p    reports that he has never smoked. He does not have any smokeless tobacco history on file. He reports that he drinks alcohol. He reports that he does not use illicit drugs. family history includes Alcohol abuse in his other and paternal uncle; Alzheimer's disease in his maternal grandmother; Arthritis in his daughter; COPD in his mother; Cancer in his maternal uncle and other; Depression in his other; Emphysema in his maternal grandfather and mother; Heart attack in his father; Heart disease in his other; Hypertension in his father; Macular degeneration in his paternal grandmother. No Known Allergies No current outpatient prescriptions on file prior to visit.   No current facility-administered medications on file prior to visit.    Review of Systems Constitutional: Negative for  increased diaphoresis, other activity, appetite or siginficant weight change other than noted HENT: Negative for worsening hearing loss, ear pain, facial swelling, mouth sores and neck stiffness.   Eyes: Negative for other worsening pain, redness or visual disturbance.  Respiratory: Negative for shortness of breath and wheezing  Cardiovascular: Negative for chest pain and palpitations.  Gastrointestinal: Negative for diarrhea, blood in stool, abdominal distention or other pain Genitourinary: Negative for hematuria, flank pain or change in urine volume.  Musculoskeletal: Negative for myalgias or other joint complaints.  Skin: Negative for color change and wound or drainage.  Neurological: Negative for syncope and numbness. other than noted Hematological: Negative for adenopathy. or other swelling Psychiatric/Behavioral: Negative for hallucinations, SI, self-injury, decreased concentration or other worsening agitation.      Objective:   Physical Exam BP 118/80 mmHg  Pulse 75  Temp(Src) 98 F (36.7 C) (Oral)  Resp 18  Ht 5\' 10"  (1.778 m)  Wt 193 lb 1.9 oz (87.599 kg)  BMI 27.71 kg/m2  SpO2 98% VS noted,  Constitutional: Pt is oriented to person, place, and time. Appears well-developed and well-nourished, in no significant distress Head: Normocephalic and atraumatic.  Right Ear: External ear normal.  Left Ear: External ear normal.  Nose: Nose normal.  Mouth/Throat: Oropharynx is clear and moist.  Eyes: Conjunctivae and EOM are normal. Pupils are equal, round, and reactive to light.  Neck: Normal range of motion. Neck supple. No JVD present. No tracheal deviation present or significant neck LA or mass Cardiovascular: Normal rate, regular rhythm, normal heart sounds and intact distal pulses.   Pulmonary/Chest: Effort normal and breath  sounds without rales or wheezing  Abdominal: Soft. Bowel sounds are normal. NT. No HSM  Musculoskeletal: Normal range of motion. Exhibits no edema.    Lymphadenopathy:  Has no cervical adenopathy.  Neurological: Pt is alert and oriented to person, place, and time. Pt has normal reflexes. No cranial nerve deficit. Motor grossly intact Skin: Skin is warm and dry. No rash noted.  Psychiatric:  Has normal mood and affect. Behavior is normal.      Assessment & Plan:

## 2014-12-13 NOTE — Patient Instructions (Signed)

## 2014-12-13 NOTE — Assessment & Plan Note (Signed)

## 2014-12-13 NOTE — Progress Notes (Signed)
Pre visit review using our clinic review tool, if applicable. No additional management support is needed unless otherwise documented below in the visit note. 

## 2014-12-14 ENCOUNTER — Telehealth: Payer: Self-pay | Admitting: Internal Medicine

## 2014-12-14 NOTE — Telephone Encounter (Signed)
Patient called for his lab results from 03/31

## 2014-12-17 NOTE — Telephone Encounter (Signed)
Called pt no answer LMOM md has mailed out lab letter but did relay md response...Raechel Chute/lmb

## 2015-10-02 ENCOUNTER — Ambulatory Visit (INDEPENDENT_AMBULATORY_CARE_PROVIDER_SITE_OTHER): Payer: BC Managed Care – PPO | Admitting: Urgent Care

## 2015-10-02 VITALS — BP 122/72 | HR 85 | Temp 98.8°F | Resp 17 | Ht 69.0 in | Wt 193.0 lb

## 2015-10-02 DIAGNOSIS — K529 Noninfective gastroenteritis and colitis, unspecified: Secondary | ICD-10-CM

## 2015-10-02 DIAGNOSIS — E860 Dehydration: Secondary | ICD-10-CM

## 2015-10-02 DIAGNOSIS — R197 Diarrhea, unspecified: Secondary | ICD-10-CM

## 2015-10-02 LAB — POCT URINALYSIS DIP (MANUAL ENTRY)
Glucose, UA: NEGATIVE
Ketones, POC UA: NEGATIVE
Leukocytes, UA: NEGATIVE
NITRITE UA: NEGATIVE
PH UA: 6
RBC UA: NEGATIVE
Spec Grav, UA: >=1.03
UROBILINOGEN UA: 0.2

## 2015-10-02 LAB — POCT CBC
Granulocyte percent: 55.5 %G (ref 37–80)
HCT, POC: 49.7 % (ref 43.5–53.7)
Hemoglobin: 16.8 g/dL (ref 14.1–18.1)
LYMPH, POC: 1.3 (ref 0.6–3.4)
MCH: 30.9 pg (ref 27–31.2)
MCHC: 33.9 g/dL (ref 31.8–35.4)
MCV: 91.1 fL (ref 80–97)
MID (CBC): 0.4 (ref 0–0.9)
MPV: 6.6 fL (ref 0–99.8)
POC Granulocyte: 2.1 (ref 2–6.9)
POC LYMPH PERCENT: 34.6 %L (ref 10–50)
POC MID %: 9.9 % (ref 0–12)
Platelet Count, POC: 156 10*3/uL (ref 142–424)
RBC: 5.46 M/uL (ref 4.69–6.13)
RDW, POC: 12.8 %
WBC: 3.7 10*3/uL — AB (ref 4.6–10.2)

## 2015-10-02 LAB — POC MICROSCOPIC URINALYSIS (UMFC)

## 2015-10-02 NOTE — Progress Notes (Signed)
MRN: [id number] DOB: 07/20/1981  Subjective:   Daniel Gallegos is a 35 y.o. male presenting for chief complaint of Diarrhea  Reports that he has since had 4 day history of watery diarrhea, has had a couple of episodes of fever, has had bloating and abnormal bowel sounds. Has not had nausea or vomiting, has been trying Imodium. Had 1 solid stool today but still mostly having watery diarrhea. Denies bloody stools, abdominal pain. Patient has had to take 2 days off of work. 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   Jotham currently has no medications in their medication list. Also has No Known Allergies.  Brenden  has a past medical history of Hypertriglyceridemia (11/01/2011) and HYPERLIPIDEMIA (07/24/2010). Also  has past surgical history that includes Cardiovascular stress test (Jan 2011).  Objective:   Vitals: BP 122/72 mmHg  Pulse 85  Temp(Src) 98.8 F (37.1 C) (Oral)  Resp 17  Ht  (1.753 m)  Wt 193 lb (87.544 kg)  BMI 28.49 kg/m2  SpO2 96%  Wt Readings from Last 3 Encounters:  10/02/15 193 lb (87.544 kg)  12/13/14 193 lb 1.9 oz (87.599 kg)  11/27/14 195 lb 9.6 oz (88.724 kg)   Physical Exam  Constitutional: He is oriented to person, place, and time. He appears well-developed and well-nourished.  HENT:  Mouth/Throat: Oropharynx is clear and moist.  Eyes: No scleral icterus.  Cardiovascular: Normal rate, regular rhythm and intact distal pulses.  Exam reveals no gallop and no friction rub.   No murmur heard. Pulmonary/Chest: No respiratory distress. He has no wheezes. He has no rales.  Abdominal: Soft. Bowel sounds are normal. He exhibits no distension and no mass. There is no tenderness.  Neurological: He is alert and oriented to person, place, and time.  Skin: Skin is warm and dry.   Results for orders placed or performed in visit on 10/02/15 (from the past 24 hour(s))  POCT  urinalysis dipstick     Status: Abnormal   Collection Time: 10/02/15  4:00 PM  Result Value Ref Range   Color, UA yellow yellow   Clarity, UA clear clear   Glucose, UA negative negative   Bilirubin, UA small (A) negative   Ketones, POC UA negative negative   Spec Grav, UA >=1.030    Blood, UA negative negative   pH, UA 6.0    Protein Ur, POC =100 (A) negative   Urobilinogen, UA 0.2    Nitrite, UA Negative Negative   Leukocytes, UA Negative Negative  POCT CBC     Status: Abnormal   Collection Time: 10/02/15  4:01 PM  Result Value Ref Range   WBC 3.7 (A) 4.6 - 10.2 K/uL   Lymph, poc 1.3 0.6 - 3.4   POC LYMPH PERCENT 34.6 10 - 50 %L   MID (cbc) 0.4 0 - 0.9   POC MID % 9.9 0 - 12 %M   POC Granulocyte 2.1 2 - 6.9   Granulocyte percent 55.5 37 - 80 %G   RBC 5.46 4.69 - 6.13 M/uL   Hemoglobin 16.8 14.1 - 18.1 g/dL   HCT, POC 46.9 62.9 - 53.7 %   MCV 91.1 80 - 97 fL   MCH, POC 30.9 27 - 31.2 pg   MCHC 33.9 31.8 - 35.4 g/dL   RDW, POC 52.8 %   Platelet Count, POC 156 142 - 424 K/uL   MPV 6.6 0 -  99.8 fL   Assessment and Plan :   1. Gastroenteritis 2. Watery diarrhea 3. Dehydration - Patient significantly improved s/p 2L IV fluids. Counseled patient on diagnosis of viral gastroenteritis, anticipatory guidance provided. Patient to rtc if diarrhea does not resolve, consider stool culture at that point.  Wallis Bamberg, PA-C [facility] [phone] 10/02/2015 3:27 PM

## 2015-10-02 NOTE — Patient Instructions (Signed)

## 2017-03-24 ENCOUNTER — Encounter: Payer: Self-pay | Admitting: Physician Assistant

## 2017-03-24 ENCOUNTER — Ambulatory Visit (INDEPENDENT_AMBULATORY_CARE_PROVIDER_SITE_OTHER): Payer: Self-pay | Admitting: Physician Assistant

## 2017-03-24 VITALS — BP 136/87 | HR 88 | Temp 98.0°F | Resp 16 | Ht 69.0 in | Wt 212.8 lb

## 2017-03-24 DIAGNOSIS — Z021 Encounter for pre-employment examination: Secondary | ICD-10-CM

## 2017-03-24 DIAGNOSIS — Z0289 Encounter for other administrative examinations: Secondary | ICD-10-CM

## 2017-03-24 DIAGNOSIS — Z23 Encounter for immunization: Secondary | ICD-10-CM

## 2017-03-24 NOTE — Patient Instructions (Addendum)
     IF you received an x-ray today, you will receive an invoice from [facility]. Please contact [facility] at [phone] with questions or concerns regarding your invoice.   IF you received labwork today, you will receive an invoice from [facility]. Please contact [facility] at [phone] with questions or concerns regarding your invoice.   Our billing staff will not be able to assist you with questions regarding bills from these companies.  You will be contacted with the lab results as soon as they are available. The fastest way to get your results is to activate your My Chart account. Instructions are located on the last page of this paperwork. If you have not heard from us regarding the results in 2 weeks, please contact this office.     

## 2017-03-24 NOTE — Progress Notes (Signed)

## 2017-03-24 NOTE — Progress Notes (Signed)
Daniel Gallegos  MRN: [id number] DOB: 07/29/1981  Subjective:  Daniel Gallegos is a 36 y.o. healthy male seen in office today for a chief complaint of need of employment physical. 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 He is not up to date on his Td vaccine. Last Td was in 2005. In terms of diet, he notes he eats a well balanced meal. He drinks lots of water. In terms of exercise, he goes to the gym regularly and lifts weights. He is not currently on any medications. Has no other questions or concerns today.   Review of Systems  Constitutional: Negative for activity change, appetite change, chills, diaphoresis, fatigue, fever and unexpected weight change.  HENT: Negative for congestion, dental problem, drooling, ear discharge, ear pain, facial swelling, hearing loss, mouth sores, nosebleeds, postnasal drip, rhinorrhea, sinus pain, sinus pressure, sneezing, sore throat, tinnitus, trouble swallowing and voice change.   Eyes: Negative for photophobia, pain, discharge, redness, itching and visual disturbance.  Respiratory: Negative for apnea, cough, choking, chest tightness, shortness of breath, wheezing and stridor.   Cardiovascular: Negative for chest pain, palpitations and leg swelling.  Gastrointestinal: Negative for abdominal distention, abdominal pain, anal bleeding, blood in stool, constipation, diarrhea, nausea, rectal pain and vomiting.  Endocrine: Negative for cold intolerance, heat intolerance, polydipsia, polyphagia and polyuria.  Genitourinary: Negative for decreased urine volume, difficulty urinating, discharge, dysuria, enuresis, flank pain, frequency, genital sores, hematuria, penile pain, penile swelling, scrotal swelling, testicular pain and urgency.  Musculoskeletal: Negative for arthralgias, back pain, gait problem, joint swelling, myalgias, neck pain and neck stiffness.  Skin: Negative for color change, pallor, rash and wound.  Allergic/Immunologic: Negative for  environmental allergies, food allergies and immunocompromised state.  Neurological: Negative for dizziness, tremors, seizures, syncope, facial asymmetry, speech difficulty, weakness, light-headedness, numbness and headaches.  Hematological: Negative for adenopathy. Does not bruise/bleed easily.  Psychiatric/Behavioral: Negative for agitation, behavioral problems, confusion, decreased concentration, dysphoric mood, hallucinations, self-injury, sleep disturbance and suicidal ideas. The patient is not nervous/anxious and is not hyperactive.       Patient Active Problem List   Diagnosis Date Noted  . Preventative health care 12/13/2014  . Influenza A 11/28/2014  . Tendonitis 11/02/2011  . Hypertriglyceridemia 11/01/2011  . HYPERLIPIDEMIA 07/24/2010    No current outpatient prescriptions on file prior to visit.   No current facility-administered medications on file prior to visit.     No Known Allergies   Objective:  BP 136/87   Pulse 88   Temp 98 F (36.7 C) (Oral)   Resp 16   Ht 5\' 9"  (1.753 m)   Wt 212 lb 12.8 oz (96.5 kg)   SpO2 100%   BMI 31.43 kg/m   Physical Exam  Constitutional: He is oriented to person, place, and time and well-developed, well-nourished, and in no distress.  HENT:  Head: Normocephalic and atraumatic.  Right Ear: Hearing, tympanic membrane, external ear and ear canal normal.  Left Ear: Hearing, tympanic membrane, external ear and ear canal normal.  Nose: Nose normal.  Mouth/Throat: Uvula is midline, oropharynx is clear and moist and mucous membranes are normal. No oropharyngeal exudate.  Eyes: Pupils are equal, round, and reactive to light. Conjunctivae and EOM are normal.  Neck: Trachea normal and normal range of motion.  Cardiovascular: Normal rate, regular rhythm, normal heart sounds and intact distal pulses.   Pulmonary/Chest: Effort normal and breath sounds normal.  Abdominal: Soft. Normal appearance and bowel sounds are normal.    Musculoskeletal: Normal range  of motion.  Lymphadenopathy:       Head (right side): No submental, no submandibular, no tonsillar, no preauricular, no posterior auricular and no occipital adenopathy present.       Head (left side): No submental, no submandibular, no tonsillar, no preauricular, no posterior auricular and no occipital adenopathy present.    He has no cervical adenopathy.       Right: No supraclavicular adenopathy present.       Left: No supraclavicular adenopathy present.  Neurological: He is alert and oriented to person, place, and time. He has normal sensation, normal strength and normal reflexes. Gait normal.  Skin: Skin is warm and dry.  Psychiatric: Affect normal.  Vitals reviewed.   Assessment and Plan :  1. Encounter for physical examination related to employment Employment physical form completed and given to patient. Return as needed.   2. Need for Td vaccine - Td vaccine greater than or equal to 7yo preservative free IM  Benjiman Core, PA-C  [facility] 03/26/2017 8:52 AM

## 2021-01-24 ENCOUNTER — Emergency Department (HOSPITAL_BASED_OUTPATIENT_CLINIC_OR_DEPARTMENT_OTHER)
Admission: EM | Admit: 2021-01-24 | Discharge: 2021-01-24 | Disposition: A | Discharge status: Home / Self Care | Payer: BC Managed Care – PPO | Attending: Emergency Medicine | Admitting: Emergency Medicine

## 2021-01-24 ENCOUNTER — Encounter (HOSPITAL_BASED_OUTPATIENT_CLINIC_OR_DEPARTMENT_OTHER): Payer: Self-pay | Admitting: *Deleted

## 2021-01-24 ENCOUNTER — Emergency Department (HOSPITAL_BASED_OUTPATIENT_CLINIC_OR_DEPARTMENT_OTHER): Payer: BC Managed Care – PPO

## 2021-01-24 ENCOUNTER — Other Ambulatory Visit: Payer: Self-pay

## 2021-01-24 DIAGNOSIS — Z8616 Personal history of COVID-19: Secondary | ICD-10-CM | POA: Insufficient documentation

## 2021-01-24 DIAGNOSIS — R0789 Other chest pain: Secondary | ICD-10-CM

## 2021-01-24 DIAGNOSIS — R110 Nausea: Secondary | ICD-10-CM | POA: Diagnosis not present

## 2021-01-24 DIAGNOSIS — R072 Precordial pain: Secondary | ICD-10-CM | POA: Insufficient documentation

## 2021-01-24 DIAGNOSIS — R0602 Shortness of breath: Secondary | ICD-10-CM | POA: Insufficient documentation

## 2021-01-24 DIAGNOSIS — Z7982 Long term (current) use of aspirin: Secondary | ICD-10-CM | POA: Diagnosis not present

## 2021-01-24 LAB — BASIC METABOLIC PANEL
Anion gap: 9 (ref 5–15)
BUN: 14 mg/dL (ref 6–20)
CO2: 25 mmol/L (ref 22–32)
Calcium: 9.3 mg/dL (ref 8.9–10.3)
Chloride: 100 mmol/L (ref 98–111)
Creatinine, Ser: 1.08 mg/dL (ref 0.61–1.24)
GFR, Estimated: >60 mL/min (ref >60)
Glucose, Bld: 105 mg/dL — ABNORMAL HIGH (ref 70–99)
Potassium: 3.8 mmol/L (ref 3.5–5.1)
Sodium: 134 mmol/L — ABNORMAL LOW (ref 135–145)

## 2021-01-24 LAB — TROPONIN I (HIGH SENSITIVITY)
Troponin I (High Sensitivity): 11 ng/L (ref <18)
Troponin I (High Sensitivity): 10 ng/L (ref <18)

## 2021-01-24 LAB — CBC
HCT: 47.6 % (ref 39.0–52.0)
Hemoglobin: 17.1 g/dL — ABNORMAL HIGH (ref 13.0–17.0)
MCH: 31.1 pg (ref 26.0–34.0)
MCHC: 35.9 g/dL (ref 30.0–36.0)
MCV: 86.7 fL (ref 80.0–100.0)
Platelets: 242 10*3/uL (ref 150–400)
RBC: 5.49 MIL/uL (ref 4.22–5.81)
RDW: 12.6 % (ref 11.5–15.5)
WBC: 8.1 10*3/uL (ref 4.0–10.5)
nRBC: 0 % (ref 0.0–0.2)

## 2021-01-24 LAB — HEPATIC FUNCTION PANEL
ALT: 26 U/L (ref 0–44)
AST: 25 U/L (ref 15–41)
Albumin: 3.9 g/dL (ref 3.5–5.0)
Alkaline Phosphatase: 58 U/L (ref 38–126)
Bilirubin, Direct: 0.2 mg/dL (ref 0.0–0.2)
Indirect Bilirubin: 0.9 mg/dL (ref 0.3–0.9)
Total Bilirubin: 1.1 mg/dL (ref 0.3–1.2)
Total Protein: 7.1 g/dL (ref 6.5–8.1)

## 2021-01-24 LAB — LIPASE, BLOOD: Lipase: 33 U/L (ref 11–51)

## 2021-01-24 MED ORDER — LIDOCAINE VISCOUS HCL 2 % MT SOLN
15.0000 mL | Freq: Once | OROMUCOSAL | Status: AC
  Administered 2021-01-24: 15 mL via ORAL
  Filled 2021-01-24: qty 15

## 2021-01-24 MED ORDER — ALUM & MAG HYDROXIDE-SIMETH 200-200-20 MG/5ML PO SUSP
30.0000 mL | Freq: Once | ORAL | Status: AC
  Administered 2021-01-24: 30 mL via ORAL
  Filled 2021-01-24: qty 30

## 2021-01-24 MED ORDER — IOHEXOL 350 MG/ML SOLN
100.0000 mL | Freq: Once | INTRAVENOUS | Status: AC | PRN
  Administered 2021-01-24: 100 mL via INTRAVENOUS

## 2021-01-24 NOTE — ED Provider Notes (Signed)
[facility] EMERGENCY DEPARTMENT Provider Note   CSN: [id number] Arrival date & time: 01/24/21  1427     History Chief Complaint  Patient presents with  . Chest Pain    Daniel Gallegos is a 40 y.o. male.  40 y.o male with a PMH of Hyperlipidemia, Hypertriglyceridemia presents to the ED with a chief complaint of chest pain x today. Patient reports he woke up this morning, felt "a little off ", states he began to feel heaviness to the left side of his chest with radiation to his left arm and left neck.  Had a similar episode in the past, however today he felt like "I could not function".  States his episode has been intermittent in the last couple of weeks.  However exacerbated today.  He is currently employed as a Runner, broadcasting/film/videoteacher, reports when the episode came it was associated with heaviness to the chest along with nausea.  Recently evaluated by primary care via telehealth 2 weeks ago, thought he was likely experiencing symptoms of residual COVID infection from last August.  The symptoms are alleviated with belching, states there is some improvement.  Does feel like if he ran a mile, symptoms will likely "worsen I could not do it ".  However, patient did play a 3-hour baseball game last week without any dyspnea.  He did have a stress test along with recent cardiac work-up likely in October of last year which was within normal limits.  He does have a family history of cardiac disease, with his father dying from an MI at the age of 40.  Patient currently takes lisinopril, states he has switched on half of the medication as he was likely having symptoms from it.  No URI symptoms, fever, cough, other complaints.  The history is provided by the patient.  Chest Pain Pain location:  Substernal area Pain quality: sharp   Pain radiates to:  L arm and neck Pain severity:  Moderate Onset quality:  Sudden Duration:  2 weeks Timing:  Intermittent Progression:  Worsening Chronicity:  New Relieved  by:  Antacids Worsened by:  Exertion Associated symptoms: shortness of breath   Associated symptoms: no abdominal pain, no back pain, no cough, no fever, no headache, no nausea and no vomiting   Risk factors: high cholesterol, hypertension and male sex   Risk factors: no coronary artery disease and no diabetes mellitus        Past Medical History:  Diagnosis Date  . HYPERLIPIDEMIA 07/24/2010  . Hypertriglyceridemia 11/01/2011    Patient Active Problem List   Diagnosis Date Noted  . Preventative health care 12/13/2014  . Influenza A 11/28/2014  . Tendonitis 11/02/2011  . Hypertriglyceridemia 11/01/2011  . HYPERLIPIDEMIA 07/24/2010    Past Surgical History:  Procedure Laterality Date  . CARDIOVASCULAR STRESS TEST  Jan 2011   s/p       Family History  Problem Relation Age of Onset  . Heart attack Father   . Hypertension Father   . Arthritis Daughter   . Cancer Maternal Uncle        Colon Cancer  . Alcohol abuse Paternal Uncle   . Heart disease Other        Paternal-Mult Heart Disease  . Cancer Other        Maternal-Mult Cancer  . Depression Other        Grandmother  . Alcohol abuse Other        Extended relatives  . COPD Mother   . Emphysema Mother   .  Alzheimer's disease Maternal Grandmother   . Emphysema Maternal Grandfather   . Macular degeneration Paternal Grandmother     Social History   Tobacco Use  . Smoking status: Never Smoker  . Smokeless tobacco: Never Used  Substance Use Topics  . Alcohol use: Not Currently  . Drug use: No    Home Medications Prior to Admission medications   Medication Sig Start Date End Date Taking? Authorizing Provider  albuterol (VENTOLIN HFA) 108 (90 Base) MCG/ACT inhaler Inhale into the lungs. 11/11/20  Yes [provider]  aspirin 81 MG EC tablet Take by mouth.   Yes [provider]  lisinopril (ZESTRIL) 10 MG tablet Take by mouth. 12/25/20  Yes [provider]  magnesium oxide (MAG-OX) 400  MG tablet Take by mouth.   Yes [provider]  Omega-3 Fatty Acids (FISH OIL) 1000 MG CAPS Take 1 capsule by mouth daily.   Yes [provider]  Zinc 50 MG TABS Take by mouth. 05/11/20  Yes [provider]    Allergies    Patient has no known allergies.  Review of Systems   Review of Systems  Constitutional: Negative for fever.  HENT: Negative for sore throat.   Respiratory: Positive for shortness of breath. Negative for cough.   Cardiovascular: Positive for chest pain. Negative for leg swelling.  Gastrointestinal: Negative for abdominal pain, nausea and vomiting.  Genitourinary: Negative for flank pain.  Musculoskeletal: Negative for back pain.  Skin: Negative for pallor and wound.  Neurological: Negative for light-headedness and headaches.  All other systems reviewed and are negative.   Physical Exam Updated Vital Signs BP (!) 146/95   Pulse 64   Temp 98.6 F (37 C) (Oral)   Resp 18   Ht 5' 9.5" (1.765 m)   Wt 90.7 kg   SpO2 94%   BMI 29.11 kg/m   Physical Exam Vitals and nursing note reviewed.  Constitutional:      Appearance: He is well-developed.  HENT:     Head: Normocephalic and atraumatic.     Nose: Nose normal.     Mouth/Throat:     Mouth: Mucous membranes are moist.  Eyes:     Pupils: Pupils are equal, round, and reactive to light.  Cardiovascular:     Rate and Rhythm: Normal rate.     Pulses:          Radial pulses are 2+ on the right side and 2+ on the left side.     Heart sounds: No murmur heard.     Comments: No BL pitting edema, no calf tenderness.  Pulmonary:     Effort: Pulmonary effort is normal.     Breath sounds: Normal breath sounds. No wheezing or rhonchi.     Comments: Lungs are clear to auscultation without wheezing, rhonchi, or rales.  Chest:     Chest wall: No tenderness.  Abdominal:     Palpations: Abdomen is soft. There is no mass.  Musculoskeletal:     Cervical back: Normal range of motion and neck  supple.     Right lower leg: No tenderness. No edema.     Left lower leg: No tenderness. No edema.  Skin:    General: Skin is warm and dry.  Neurological:     Mental Status: He is alert and oriented to person, place, and time.     ED Results / Procedures / Treatments   Labs (all labs ordered are listed, but only abnormal results are displayed) Labs  Reviewed  BASIC METABOLIC PANEL - Abnormal; Notable for the following components:      Result Value   Sodium 134 (*)    Glucose, Bld 105 (*)    All other components within normal limits  CBC - Abnormal; Notable for the following components:   Hemoglobin 17.1 (*)    All other components within normal limits  HEPATIC FUNCTION PANEL  LIPASE, BLOOD  TROPONIN I (HIGH SENSITIVITY)  TROPONIN I (HIGH SENSITIVITY)    EKG EKG Interpretation  Date/Time:  Friday Jan 24 2021 14:37:27 EDT Ventricular Rate:  74 PR Interval:  138 QRS Duration: 92 QT Interval:  372 QTC Calculation: 412 R Axis:   47 Text Interpretation: Normal sinus rhythm with sinus arrhythmia Normal ECG No old tracing to compare Confirmed by Jacalyn Lefevre [phone]) on 01/24/2021 4:38:40 PM   Radiology DG Chest 2 View  Result Date: 01/24/2021 CLINICAL DATA:  Chest pain. EXAM: CHEST - 2 VIEW COMPARISON:  12/29/2012 FINDINGS: The heart size and mediastinal contours are within normal limits. Both lungs are clear. The visualized skeletal structures are unremarkable. IMPRESSION: No active cardiopulmonary disease. Electronically Signed   By: Norva Pavlov M.D.   On: 01/24/2021 15:04   CT Angio Chest PE W and/or Wo Contrast  Result Date: 01/24/2021 CLINICAL DATA:  Chest pain and shortness of breath. EXAM: CT ANGIOGRAPHY CHEST WITH CONTRAST TECHNIQUE: Multidetector CT imaging of the chest was performed using the standard protocol during bolus administration of intravenous contrast. Multiplanar CT image reconstructions and MIPs were obtained to evaluate the vascular anatomy.  CONTRAST:  OMNIPAQUE IOHEXOL 350 MG/ML SOLN COMPARISON:  CT chest dated January 09, 2013. FINDINGS: Cardiovascular: Satisfactory opacification of the pulmonary arteries to the segmental level. No evidence of pulmonary embolism. Normal heart size. No pericardial effusion. No thoracic aortic aneurysm or dissection. Mediastinum/Nodes: No enlarged mediastinal, hilar, or axillary lymph nodes. Thyroid gland, trachea, and esophagus demonstrate no significant findings. Lungs/Pleura: Lungs are clear. No pleural effusion or pneumothorax. Upper Abdomen: No acute abnormality. Multiple low-density lesions scattered throughout the liver have mildly increased in size since 2014. These likely represent hepatic cysts versus biliary hamartomas. Left renal simple cyst has also increased in size, currently measuring 5.2 cm. Musculoskeletal: No chest wall abnormality. No acute or significant osseous findings. Review of the MIP images confirms the above findings. IMPRESSION: 1. No evidence of pulmonary embolism. No acute intrathoracic process. Electronically Signed   By: Obie Dredge M.D.   On: 01/24/2021 17:59    Procedures Procedures   Medications Ordered in ED Medications  alum & mag hydroxide-simeth (MAALOX/MYLANTA) 200-200-20 MG/5ML suspension 30 mL (30 mLs Oral Given 01/24/21 1520)    And  lidocaine (XYLOCAINE) 2 % viscous mouth solution 15 mL (15 mLs Oral Given 01/24/21 1520)  iohexol (OMNIPAQUE) 350 MG/ML injection 100 mL (100 mLs Intravenous Contrast Given 01/24/21 1719)    ED Course  I have reviewed the triage vital signs and the nursing notes.  Pertinent labs & imaging results that were available during my care of the patient were reviewed by me and considered in my medical decision making (see chart for details).    MDM Rules/Calculators/A&P     Patient with a past medical history of hypercholesteremia, hyperlipidemia currently on lisinopril presents to the ED with a chief complaint of left-sided  chest heaviness that began this morning after waking up, episode was associated with some nausea.  Had a similar episode in the past, for the past 2 weeks but today it is  felt worse than usual.  Recent telehealth visit with PCP, was told that this was likely residual due to his COVID-19 infection back in August.  Patient does report symptoms are alleviated with belching, exacerbated when he goes on a run.  Although he had played a baseball game last week for about 3 hours without any problems.  No URI symptoms or sick contact.  Primary evaluation patient appears nontoxic, non-ill-appearing, lungs are clear to auscultation without any wheezing, rhonchi, rales.  No tenderness with palpation of the chest.  Abdomen is soft, nontender to palpation.  No bilateral calf tenderness or pitting edema.  Oropharynx is clear without any erythema or exudates present.  Vitals are unremarkable, blood pressure slightly elevated with systolics in the 140s.  No tachycardia or hypoxia. Differential diagnosis included but not limited to ACS versus pneumonia versus reflux.    In addition, patient does report he has been under a lot of stress, his mother recently passed away.  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  He does report he has been under a lot of stress.  Xray of the chest showed: No active cardiopulmonary disease.  Interpretation of his labs showed a CBC without any leukocytosis, hemoglobin slightly increased.  BMP remarkable for slight decrease in his sodium, the rest of his electrolytes are within normal limits.  Creatinine level is normal.  First troponin is 11, will obtain delta.  He was provided with Maalox to help with likely reflux.  Discussed with patient his results from his lab work.  We discussed the risks and benefits of obtaining a CT chest to further evaluate blood clot versus tissue scarring from prior COVID-19 infection.  He  is agreeable of imaging at this time.  6:08 PM CT Angio chest showed: 1. No evidence of pulmonary embolism. No acute intrathoracic  process.      These results were given to the patient, they were also explained to him and wife at the bedside.  We recommended further follow-up with his cardiologist.  He does report a normal cardiac work-up in the last few months.  I do not feel this is ACS. Troponins are also trending down.  Vitals have remained stable, patient is stable for discharge at this time.  Return precautions discussed at length   Portions of this note were generated with Dragon dictation software. Dictation errors may occur despite best attempts at proofreading.  Final Clinical Impression(s) / ED Diagnoses Final diagnoses:  Atypical chest pain    Rx / DC Orders ED Discharge Orders    None       Claude Manges, Cordelia Poche 01/24/21 1811    Jacalyn Lefevre, MD 01/24/21 1902

## 2021-01-24 NOTE — Discharge Instructions (Addendum)
You were provided with a copy of your CT angio chest.  Your laboratory results were within normal limits today.  May follow-up with your primary care physician along with your cardiologist as needed.  You experience any chest pain, shortness of breath, worsening symptoms please return to the emergency department.

## 2021-01-24 NOTE — ED Triage Notes (Signed)
Left sided chest heaviness. States since having Covid last year he has had breathing issues. EKG at triage. He uses an inhaler.

## 2022-12-19 IMAGING — CT CT ANGIO CHEST
2 of 9 series · 18 of 36 positions shown · IV contrast (Omnipaque)
Comparison: CT chest dated January 09, 2013.

CLINICAL DATA: Chest pain and shortness of breath.

EXAM:
CT ANGIOGRAPHY CHEST WITH CONTRAST
TECHNIQUE: Multidetector CT imaging of the chest was performed using the
standard protocol during bolus administration of intravenous
contrast. Multiplanar CT image reconstructions and MIPs were
obtained to evaluate the vascular anatomy.
CONTRAST:  100mL OMNIPAQUE IOHEXOL 350 MG/ML SOLN

[Series 7: pe thins · axial · 0.84mm/px · z∈[-265,-5]mm · 17 of 294 slices shown]
[im 17/294  lung]
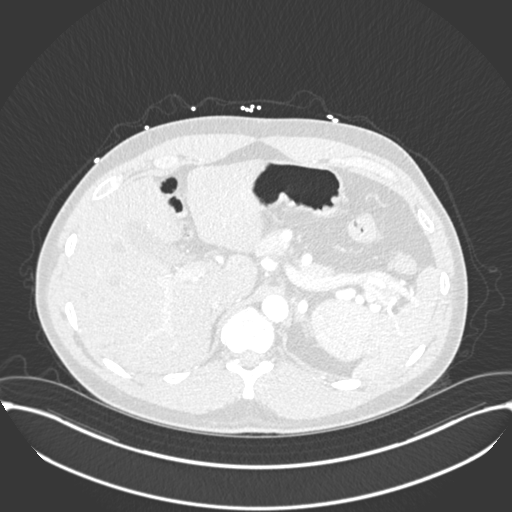
[im 33/294  mediastinal]
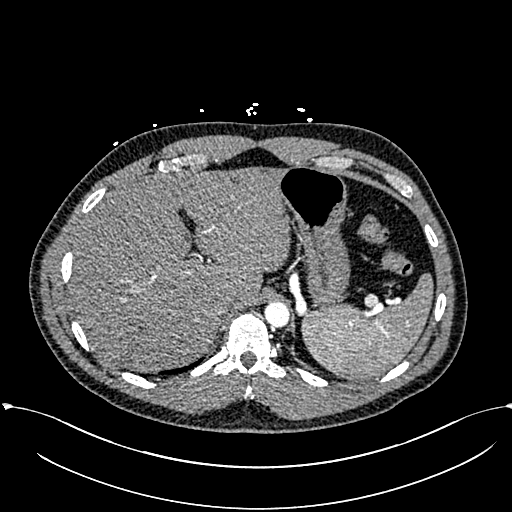
[im 49/294  lung]
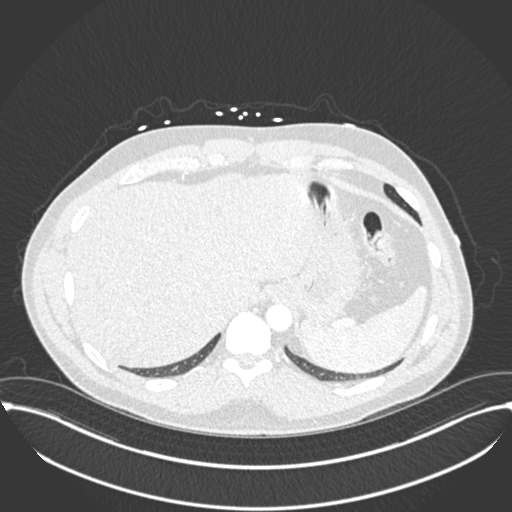
[im 66/294  mediastinal]
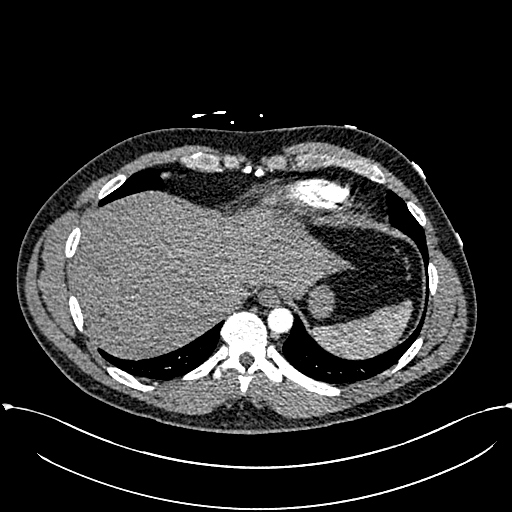
[im 82/294  lung]
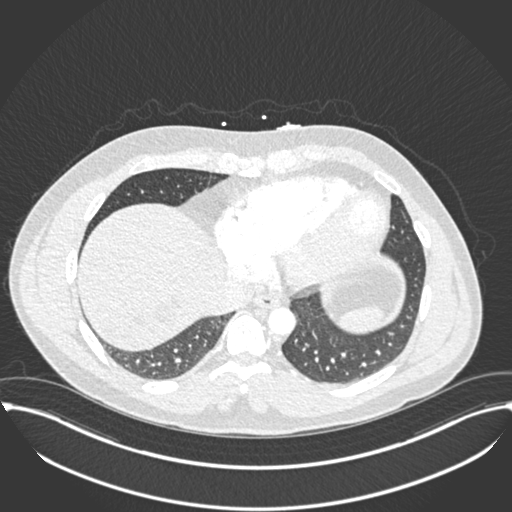
[im 98/294  mediastinal]
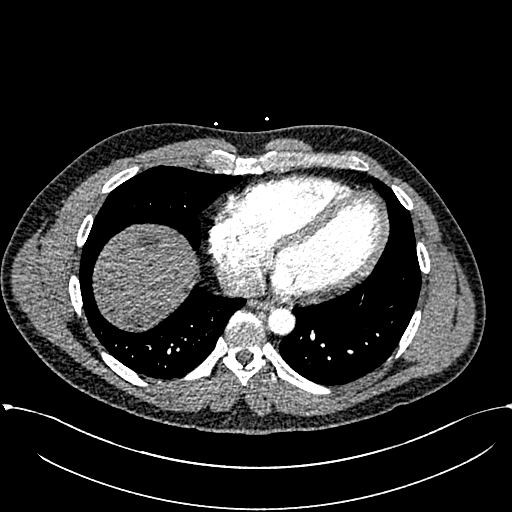
[im 114/294  lung]
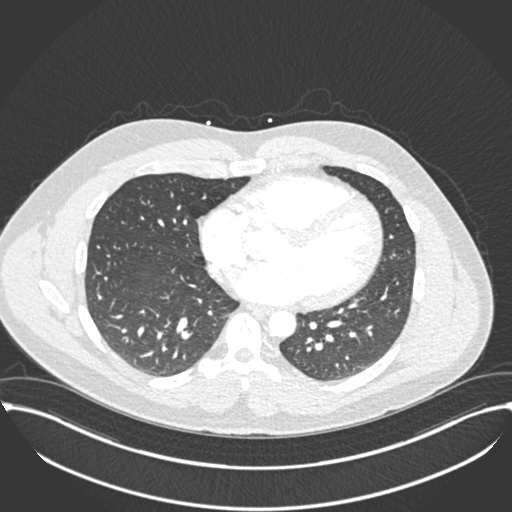
[im 131/294  mediastinal]
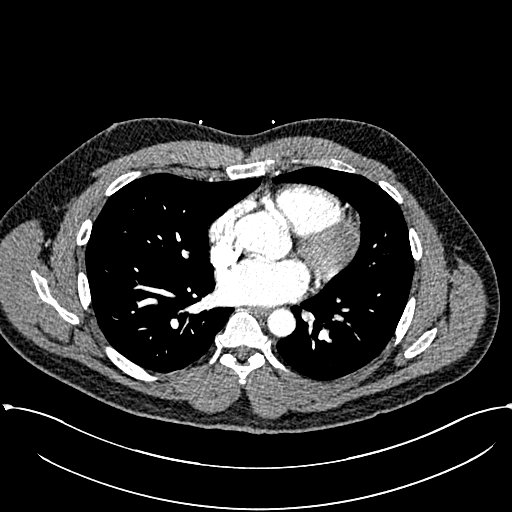
[im 147/294  lung]
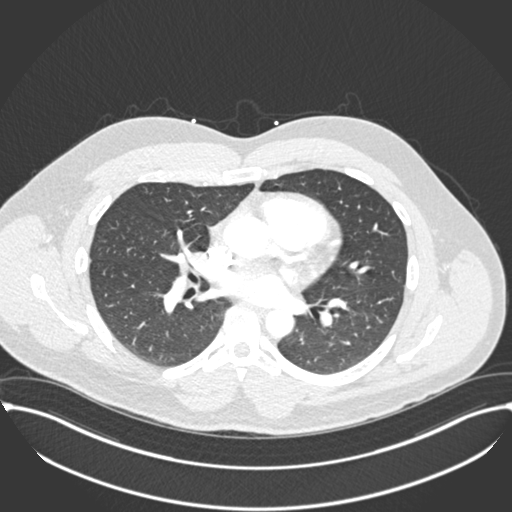
[im 163/294  mediastinal]
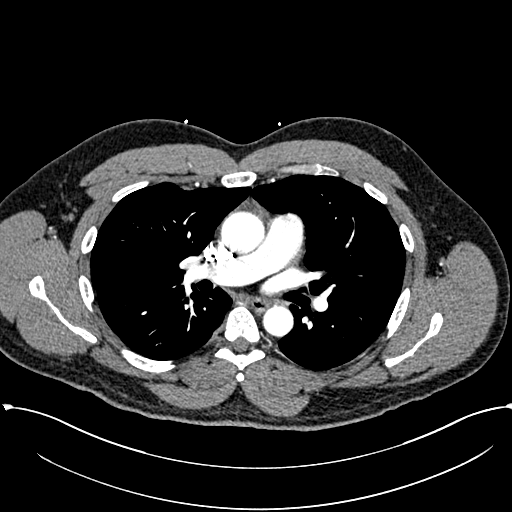
[im 180/294  lung]
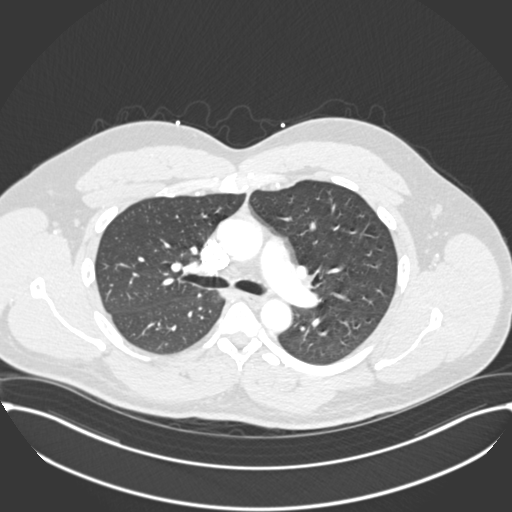
[im 196/294  mediastinal]
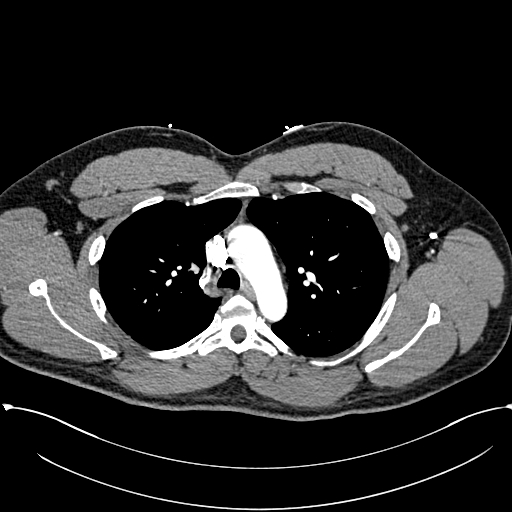
[im 212/294  lung]
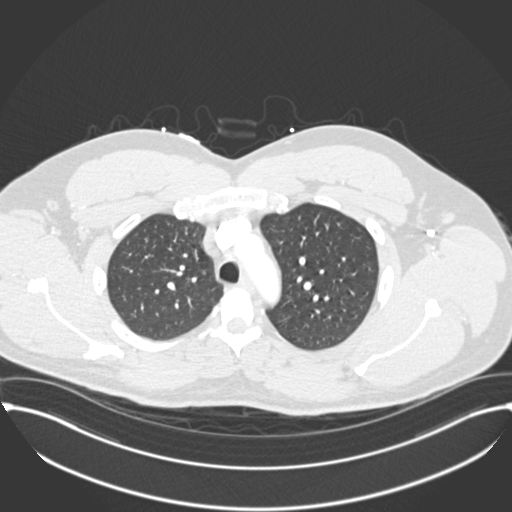
[im 228/294  mediastinal]
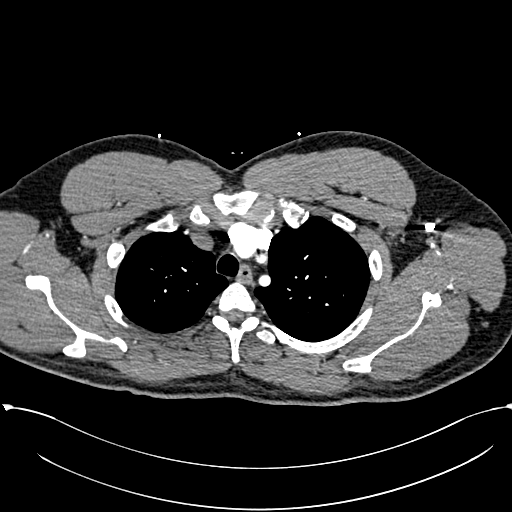
[im 245/294  lung]
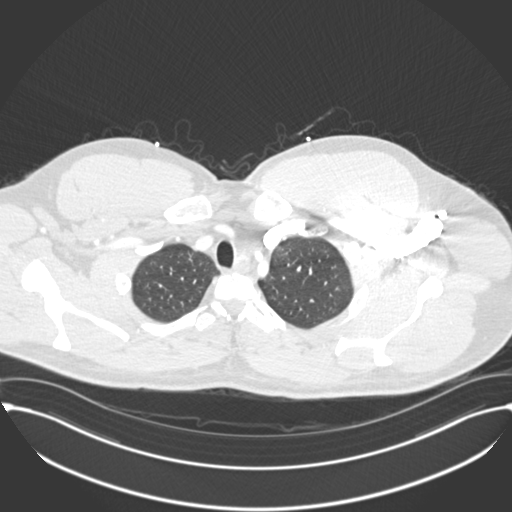
[im 261/294  mediastinal]
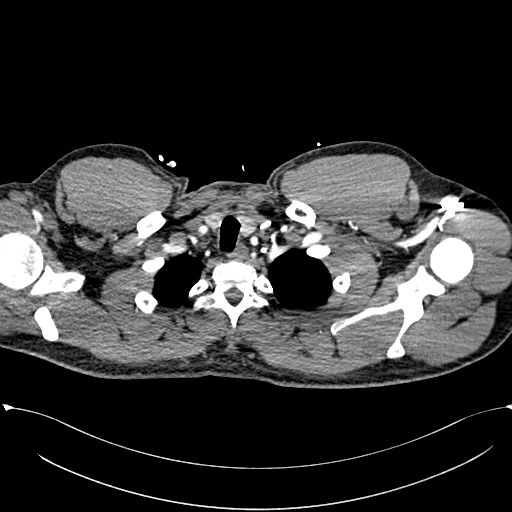
[im 277/294  lung]
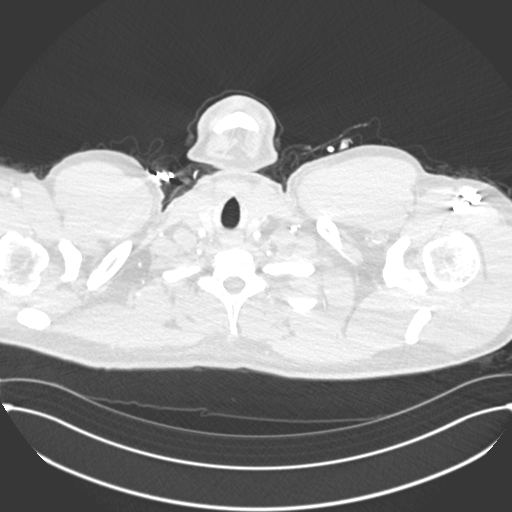

[Series 8: pe coronal mpr · coronal · 0.58mm/px · 1 of 139 slices shown]
[im 70/139  mediastinal]
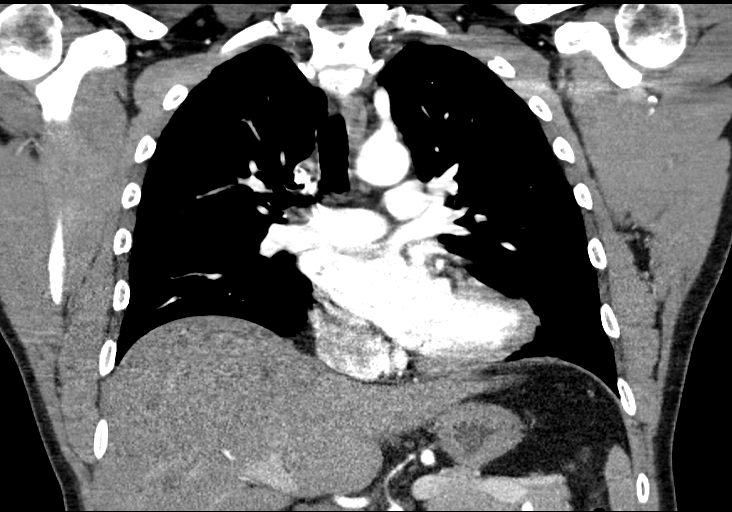

[18 of 36 positions shown; findings below may reference images not displayed]

FINDINGS: Cardiovascular: Satisfactory opacification of the pulmonary arteries
to the segmental level. No evidence of pulmonary embolism. Normal
heart size. No pericardial effusion. No thoracic aortic aneurysm or
dissection.

Mediastinum/Nodes: No enlarged mediastinal, hilar, or axillary lymph
nodes. Thyroid gland, trachea, and esophagus demonstrate no
significant findings.

Lungs/Pleura: Lungs are clear. No pleural effusion or pneumothorax.

Upper Abdomen: No acute abnormality. Multiple low-density lesions
scattered throughout the liver have mildly increased in size since
7857. These likely represent hepatic cysts versus biliary
hamartomas. Left renal simple cyst has also increased in size,
currently measuring 5.2 cm.

Musculoskeletal: No chest wall abnormality. No acute or significant
osseous findings.

Review of the MIP images confirms the above findings.
IMPRESSION: 1. No evidence of pulmonary embolism. No acute intrathoracic
process.
